# Patient Record
Sex: Male | Born: 1985 | Race: Black or African American | Hispanic: No | Marital: Single | Smoking: Never smoker
Health system: Southern US, Community
[De-identification: ages and names within clinical notes are randomized; demographics above are authoritative.]

## PROBLEM LIST (undated history)

## (undated) HISTORY — PX: OTHER SURGICAL HISTORY: SHX169

---

## 2017-05-26 ENCOUNTER — Emergency Department
Admission: EM | Admit: 2017-05-26 | Discharge: 2017-05-26 | Disposition: A | Discharge status: Home / Self Care | Payer: No Typology Code available for payment source | Attending: Emergency Medicine | Admitting: Emergency Medicine

## 2017-05-26 ENCOUNTER — Emergency Department: Payer: No Typology Code available for payment source

## 2017-05-26 ENCOUNTER — Other Ambulatory Visit: Payer: Self-pay

## 2017-05-26 DIAGNOSIS — R1031 Right lower quadrant pain: Secondary | ICD-10-CM | POA: Insufficient documentation

## 2017-05-26 DIAGNOSIS — S32009A Unspecified fracture of unspecified lumbar vertebra, initial encounter for closed fracture: Secondary | ICD-10-CM | POA: Insufficient documentation

## 2017-05-26 DIAGNOSIS — S01512A Laceration without foreign body of oral cavity, initial encounter: Secondary | ICD-10-CM | POA: Insufficient documentation

## 2017-05-26 DIAGNOSIS — Y999 Unspecified external cause status: Secondary | ICD-10-CM | POA: Insufficient documentation

## 2017-05-26 DIAGNOSIS — Y9389 Activity, other specified: Secondary | ICD-10-CM | POA: Insufficient documentation

## 2017-05-26 DIAGNOSIS — S022XXA Fracture of nasal bones, initial encounter for closed fracture: Secondary | ICD-10-CM | POA: Diagnosis not present

## 2017-05-26 DIAGNOSIS — Y92410 Unspecified street and highway as the place of occurrence of the external cause: Secondary | ICD-10-CM | POA: Insufficient documentation

## 2017-05-26 LAB — BASIC METABOLIC PANEL
Anion gap: 7 (ref 5–15)
BUN: 16 mg/dL (ref 6–20)
CALCIUM: 9.2 mg/dL (ref 8.9–10.3)
CO2: 27 mmol/L (ref 22–32)
CREATININE: 1.25 mg/dL — AB (ref 0.61–1.24)
Chloride: 104 mmol/L (ref 101–111)
GFR calc Af Amer: >60 mL/min (ref >60)
GLUCOSE: 115 mg/dL — AB (ref 65–99)
Potassium: 3.6 mmol/L (ref 3.5–5.1)
Sodium: 138 mmol/L (ref 135–145)

## 2017-05-26 LAB — CBC
HEMATOCRIT: 46 % (ref 40.0–52.0)
Hemoglobin: 15.6 g/dL (ref 13.0–18.0)
MCH: 30.8 pg (ref 26.0–34.0)
MCHC: 34 g/dL (ref 32.0–36.0)
MCV: 90.7 fL (ref 80.0–100.0)
PLATELETS: 248 10*3/uL (ref 150–440)
RBC: 5.07 MIL/uL (ref 4.40–5.90)
RDW: 13.7 % (ref 11.5–14.5)
WBC: 8.9 10*3/uL (ref 3.8–10.6)

## 2017-05-26 MED ORDER — IOPAMIDOL (ISOVUE-300) INJECTION 61%
125.0000 mL | Freq: Once | INTRAVENOUS | Status: AC | PRN
  Administered 2017-05-26: 125 mL via INTRAVENOUS

## 2017-05-26 MED ORDER — LIDOCAINE-EPINEPHRINE (PF) 1 %-1:200000 IJ SOLN
10.0000 mL | Freq: Once | INTRAMUSCULAR | Status: AC
  Administered 2017-05-26: 10 mL via INTRADERMAL
  Filled 2017-05-26: qty 30

## 2017-05-26 MED ORDER — ONDANSETRON HCL 4 MG/2ML IJ SOLN
4.0000 mg | Freq: Once | INTRAMUSCULAR | Status: AC
  Administered 2017-05-26: 4 mg via INTRAVENOUS
  Filled 2017-05-26: qty 2

## 2017-05-26 MED ORDER — MORPHINE SULFATE (PF) 4 MG/ML IV SOLN
4.0000 mg | Freq: Once | INTRAVENOUS | Status: AC
  Administered 2017-05-26: 4 mg via INTRAVENOUS
  Filled 2017-05-26: qty 1

## 2017-05-26 MED ORDER — SODIUM CHLORIDE 0.9 % IV BOLUS (SEPSIS)
1000.0000 mL | Freq: Once | INTRAVENOUS | Status: AC
  Administered 2017-05-26: 1000 mL via INTRAVENOUS

## 2017-05-26 MED ORDER — OXYCODONE-ACETAMINOPHEN 5-325 MG PO TABS: 1.0000 | ORAL_TABLET | Freq: Four times a day (QID) | ORAL | 0 refills | Status: AC | PRN
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
## 2017-05-26 NOTE — ED Notes (Signed)
Patient transported to CT at this time. 

## 2017-05-26 NOTE — ED Notes (Signed)
Upon reassessment, pt found in bed very diaphoretic, pt states, "I tried to get up but I got real light headed and started sweating."  EDP notified.

## 2017-05-26 NOTE — ED Notes (Signed)
Secretary notified to [employer] in regards to TLSO brace.

## 2017-05-26 NOTE — Discharge Instructions (Signed)
Please eat soft mechanical diet and liquids. Please follow up with neurosurgery and ENT.

## 2017-05-26 NOTE — ED Notes (Signed)
TSLO Brace applied per [employer] representative.
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
